# Patient Record
Sex: Male | Born: 1970 | Race: White | Hispanic: No | Marital: Married | Smoking: Never smoker
Health system: Southern US, Community
[De-identification: ages and names within clinical notes are randomized; demographics above are authoritative.]

## PROBLEM LIST (undated history)

## (undated) HISTORY — PX: LASIK: SHX215

---

## 2016-04-23 ENCOUNTER — Emergency Department (HOSPITAL_COMMUNITY): Payer: BLUE CROSS/BLUE SHIELD

## 2016-04-23 ENCOUNTER — Encounter (HOSPITAL_COMMUNITY): Payer: Self-pay | Admitting: Emergency Medicine

## 2016-04-23 ENCOUNTER — Emergency Department (HOSPITAL_COMMUNITY)
Admission: EM | Admit: 2016-04-23 | Discharge: 2016-04-23 | Disposition: A | Discharge status: Home / Self Care | Payer: BLUE CROSS/BLUE SHIELD | Attending: Emergency Medicine | Admitting: Emergency Medicine

## 2016-04-23 DIAGNOSIS — Z87891 Personal history of nicotine dependence: Secondary | ICD-10-CM | POA: Insufficient documentation

## 2016-04-23 DIAGNOSIS — R079 Chest pain, unspecified: Secondary | ICD-10-CM | POA: Diagnosis present

## 2016-04-23 LAB — BASIC METABOLIC PANEL
Anion gap: 8 (ref 5–15)
BUN: 20 mg/dL (ref 6–20)
CHLORIDE: 105 mmol/L (ref 101–111)
CO2: 27 mmol/L (ref 22–32)
Calcium: 9.1 mg/dL (ref 8.9–10.3)
Creatinine, Ser: 1.44 mg/dL — ABNORMAL HIGH (ref 0.61–1.24)
GFR calc non Af Amer: 57 mL/min — ABNORMAL LOW (ref >60)
Glucose, Bld: 156 mg/dL — ABNORMAL HIGH (ref 65–99)
POTASSIUM: 3.8 mmol/L (ref 3.5–5.1)
SODIUM: 140 mmol/L (ref 135–145)

## 2016-04-23 LAB — CBC
HCT: 45.2 % (ref 39.0–52.0)
HEMOGLOBIN: 15.3 g/dL (ref 13.0–17.0)
MCH: 29.6 pg (ref 26.0–34.0)
MCHC: 33.8 g/dL (ref 30.0–36.0)
MCV: 87.4 fL (ref 78.0–100.0)
Platelets: 230 10*3/uL (ref 150–400)
RBC: 5.17 MIL/uL (ref 4.22–5.81)
RDW: 12.6 % (ref 11.5–15.5)
WBC: 6 10*3/uL (ref 4.0–10.5)

## 2016-04-23 LAB — HEPATIC FUNCTION PANEL
ALBUMIN: 4.2 g/dL (ref 3.5–5.0)
ALT: 18 U/L (ref 17–63)
AST: 19 U/L (ref 15–41)
Alkaline Phosphatase: 66 U/L (ref 38–126)
BILIRUBIN INDIRECT: 0.5 mg/dL (ref 0.3–0.9)
Bilirubin, Direct: 0.2 mg/dL (ref 0.1–0.5)
TOTAL PROTEIN: 6.7 g/dL (ref 6.5–8.1)
Total Bilirubin: 0.7 mg/dL (ref 0.3–1.2)

## 2016-04-23 LAB — D-DIMER, QUANTITATIVE (NOT AT ARMC)

## 2016-04-23 LAB — LIPASE, BLOOD: LIPASE: 26 U/L (ref 11–51)

## 2016-04-23 LAB — TROPONIN I

## 2016-04-23 MED ORDER — GI COCKTAIL ~~LOC~~
30.0000 mL | Freq: Once | ORAL | Status: DC
  Filled 2016-04-23: qty 30

## 2016-04-23 NOTE — Discharge Instructions (Signed)
Follow up with dr. Karilyn Cota for a family md and follow up with the cardiologist at [facility].  Take one baby aspirin a day until recheck

## 2016-04-23 NOTE — ED Provider Notes (Signed)
[facility]-EM[facility]Y DEPT Provider Note   CSN: [id number] Arrival date & time: 04/23/16  1331  First Provider Contact:  First MD Initiated Contact with Patient 04/23/16 1342        History   Chief Complaint Chief Complaint  Patient presents with  . Chest Pain    For two weeks    HPI Matthew Charles is a 45 y.o. male.  Patient states that he's been having some left-sided chest pain and pain in his left arm. Patient has no pain now. Patient states is not related to physical activity   The history is provided by the patient. No language interpreter was used.  Chest Pain   This is a new problem. The current episode started more than 1 week ago. The problem occurs daily. The problem has been resolved. Associated with: Nothing. Pain location: Left side chest pain. The pain is at a severity of 3/10. The pain is mild. The quality of the pain is described as brief. Radiates to: Left arm. Exacerbated by: Dilated to anything. Pertinent negatives include no abdominal pain, no back pain, no cough and no headaches. He has tried rest for the symptoms. The treatment provided no relief. There are no known risk factors.  Pertinent negatives for past medical history include no aneurysm and no seizures.    History reviewed. No pertinent past medical history.  There are no active problems to display for this patient.   Past Surgical History:  Procedure Laterality Date  . LASIK         Home Medications    Prior to Admission medications   Not on File    Family History History reviewed. No pertinent family history.  Social History Social History  Substance Use Topics  . Smoking status: Former Games developer  . Smokeless tobacco: Never Used  . Alcohol use Yes     Allergies   Review of patient's allergies indicates no known allergies.   Review of Systems Review of Systems  Constitutional: Negative for appetite change and fatigue.  HENT: Negative for congestion, ear discharge and sinus  pressure.   Eyes: Negative for discharge.  Respiratory: Negative for cough.   Cardiovascular: Positive for chest pain.  Gastrointestinal: Negative for abdominal pain and diarrhea.  Genitourinary: Negative for frequency and hematuria.  Musculoskeletal: Negative for back pain.  Skin: Negative for rash.  Neurological: Negative for seizures and headaches.  Psychiatric/Behavioral: Negative for hallucinations.     Physical Exam Updated Vital Signs BP 129/91   Pulse 61   Temp 97.8 F (36.6 C) (Oral)   Resp 13   Ht 5\' 6"  (1.676 m)   Wt 177 lb (80.3 kg)   SpO2 95%   BMI 28.57 kg/m   Physical Exam  Constitutional: He is oriented to person, place, and time. He appears well-developed.  HENT:  Head: Normocephalic.  Eyes: Conjunctivae and EOM are normal. No scleral icterus.  Neck: Neck supple. No thyromegaly present.  Cardiovascular: Normal rate and regular rhythm.  Exam reveals no gallop and no friction rub.   No murmur heard. Pulmonary/Chest: No stridor. He has no wheezes. He has no rales. He exhibits no tenderness.  Abdominal: He exhibits no distension. There is no tenderness. There is no rebound.  Musculoskeletal: Normal range of motion. He exhibits no edema.  Lymphadenopathy:    He has no cervical adenopathy.  Neurological: He is oriented to person, place, and time. He exhibits normal muscle tone. Coordination normal.  Skin: No rash noted. No erythema.  Psychiatric: He has  a normal mood and affect. His behavior is normal.     ED Treatments / Results  Labs (all labs ordered are listed, but only abnormal results are displayed) Labs Reviewed  BASIC METABOLIC PANEL - Abnormal; Notable for the following:       Result Value   Glucose, Bld 156 (*)    Creatinine, Ser 1.44 (*)    GFR calc non Af Amer 57 (*)    All other components within normal limits  CBC  TROPONIN I  HEPATIC FUNCTION PANEL  LIPASE, BLOOD  D-DIMER, QUANTITATIVE (NOT AT [facility])    EKG  EKG  Interpretation  Date/Time:  Wednesday April 23 2016 13:40:33 EDT Ventricular Rate:  78 PR Interval:    QRS Duration: 94 QT Interval:  382 QTC Calculation: 436 R Axis:   64 Text Interpretation:  Sinus rhythm unifocal pvc Confirmed by Lydiah Pong  MD, Lynsie Mcwatters [phone]) on 04/23/2016 4:31:40 PM       Radiology Dg Chest Portable 1 View  Result Date: 04/23/2016 CLINICAL DATA:  Chest pain for 2 weeks EXAM: PORTABLE CHEST 1 VIEW COMPARISON:  None. FINDINGS: Lungs are clear. Heart size and pulmonary vascularity are normal. No adenopathy. No pneumothorax. No bone lesions. IMPRESSION: No edema or consolidation. Electronically Signed   By: Bretta Bang III M.D.   On: 04/23/2016 14:10   Procedures Procedures (including critical care time)  Medications Ordered in ED Medications  gi cocktail (Maalox,Lidocaine,Donnatal) (30 mLs Oral Refused 04/23/16 1418)     Initial Impression / Assessment and Plan / ED Course  I have reviewed the triage vital signs and the nursing notes.  Pertinent labs & imaging results that were available during my care of the patient were reviewed by me and considered in my medical decision making (see chart for details).  Clinical Course    Patient's labs unremarkable including a troponin. EKG shows unifocal PVCs.  Final Clinical Impressions(s) / ED Diagnoses   Final diagnoses:  Chest pain syndrome   Patient with chest pain doubt cardiac related. Patient referred to a family doctor and cardiologist for recheck New Prescriptions New Prescriptions   No medications on file     Bethann Berkshire, MD 04/23/16 1645

## 2016-04-23 NOTE — ED Triage Notes (Addendum)
Patient sent from [facility] in  [place] for evaluation of intermittent chest pain x 2 weeks radiating into left arm. States he took gas-x with relief yesterday. Denies chest pain at triage. States he was given 324 chewable ASA at [facility].

## 2016-05-07 ENCOUNTER — Encounter: Payer: Self-pay | Admitting: Cardiovascular Disease

## 2016-05-07 ENCOUNTER — Ambulatory Visit (INDEPENDENT_AMBULATORY_CARE_PROVIDER_SITE_OTHER): Payer: BLUE CROSS/BLUE SHIELD | Admitting: Cardiovascular Disease

## 2016-05-07 VITALS — BP 122/98 | HR 98 | Ht 72.0 in | Wt 182.0 lb

## 2016-05-07 DIAGNOSIS — R0789 Other chest pain: Secondary | ICD-10-CM

## 2016-05-07 DIAGNOSIS — N189 Chronic kidney disease, unspecified: Secondary | ICD-10-CM

## 2016-05-07 DIAGNOSIS — IMO0001 Reserved for inherently not codable concepts without codable children: Secondary | ICD-10-CM

## 2016-05-07 DIAGNOSIS — R030 Elevated blood-pressure reading, without diagnosis of hypertension: Secondary | ICD-10-CM

## 2016-05-07 NOTE — Progress Notes (Signed)
CARDIOLOGY CONSULT NOTE  Patient ID: Neri Samek MRN: [id number] DOB/AGE: 1971/06/08 45 y.o.  Admit date: (Not on file) Primary Physician: No PCP Per Patient Referring Physician: Estell Harpin MD  Reason for Consultation: chest pain  HPI: 45 year old male referred for evaluation of chest pain. Evaluated in the ED 7/26. I personally reviewed all labs and studies. D-dimer, lipase, hepatic function panel, CBC, and basic metabolic panel were all unremarkable other than demonstrating CKD.  Chest x-ray was normal.  ECG showed sinus rhythm with PVCs.  He denies exertional symptoms altogether. For 2 weeks prior to this presentation he experienced a dull ache in the left side of his chest, left upper quadrant of his abdomen, and left axilla. He can walk indefinitely and climb several flights of stairs without chest pain or shortness of breath. He denies leg swelling, palpitations, and syncope.  He has yet to establish with a PCP. He is unaware of cardiovascular risk factors. He does not smoke. He has no family history of premature coronary artery disease.     No Known Allergies  Current Outpatient Prescriptions  Medication Sig Dispense Refill  . aspirin 81 MG tablet Take 81 mg by mouth daily.     No current facility-administered medications for this visit.     No past medical history on file.  Past Surgical History:  Procedure Laterality Date  . LASIK      Social History   Social History  . Marital status: Married    Spouse name: N/A  . Number of children: N/A  . Years of education: N/A   Occupational History  . Not on file.   Social History Main Topics  . Smoking status: Never Smoker  . Smokeless tobacco: Never Used  . Alcohol use Yes  . Drug use: No  . Sexual activity: Not on file   Other Topics Concern  . Not on file   Social History Narrative  . No narrative on file     No family history of premature CAD in 1st degree relatives.  Prior to Admission  medications   Medication Sig Start Date End Date Taking? Authorizing Provider  aspirin 81 MG tablet Take 81 mg by mouth daily.   Yes Historical Provider, MD     Review of systems complete and found to be negative unless listed above in HPI     Physical exam Blood pressure (!) 122/98, pulse 98, height 6' (1.829 m), weight 182 lb (82.6 kg), SpO2 98 %. General: NAD Neck: No JVD, no thyromegaly or thyroid nodule.  Lungs: Clear to auscultation bilaterally with normal respiratory effort. CV: Nondisplaced PMI. Regular rate and rhythm, normal S1/S2, no S3/S4, no murmur.  No peripheral edema.  No carotid bruit.    Abdomen: Soft, nontender, no hepatosplenomegaly, no distention.  Skin: Intact without lesions or rashes.  Neurologic: Alert and oriented x 3.  Psych: Normal affect. Extremities: No clubbing or cyanosis.  HEENT: Normal.   ECG: Most recent ECG reviewed.  Labs:   Lab Results  Component Value Date   WBC 6.0 04/23/2016   HGB 15.3 04/23/2016   HCT 45.2 04/23/2016   MCV 87.4 04/23/2016   PLT 230 04/23/2016   No results for input(s): NA, K, CL, CO2, BUN, CREATININE, CALCIUM, PROT, BILITOT, ALKPHOS, ALT, AST, GLUCOSE in the last 168 hours.  Invalid input(s): LABALBU Lab Results  Component Value Date   TROPONINI <0.03 04/23/2016   No results found for: CHOL No results found for: HDL No  results found for: LDLCALC No results found for: TRIG No results found for: CHOLHDL No results found for: LDLDIRECT       Studies: No results found.  ASSESSMENT AND PLAN:  1. Chest pain: Likely non cardiac. If he were to develop exertional symptoms I would then consider exercise treadmill stress testing if clear-cut risk factors are demonstrated.  2. Elevated DBP: Needs monitoring.  3. CKD: Needs further investigation by PCP.  Dispo: fu prn.   Signed: Prentice DockerSuresh Ceriah Kohler, M.D., F.A.C.C.  05/07/2016, 8:40 AM

## 2016-05-07 NOTE — Patient Instructions (Signed)
Medication Instructions:  Continue all current medications.  Labwork: None.   Testing/Procedures: None.  Follow-Up: As needed.   Any Other Special Instructions Will Be Listed Below (If Applicable).  If you need a refill on your cardiac medications before your next appointment, please call your pharmacy.  

## 2018-04-02 IMAGING — CR DG CHEST 1V PORT
1 series · 1 of 1 positions shown · non-contrast
Comparison: None.

CLINICAL DATA: Chest pain for 2 weeks

EXAM:
PORTABLE CHEST 1 VIEW

[ap portable]
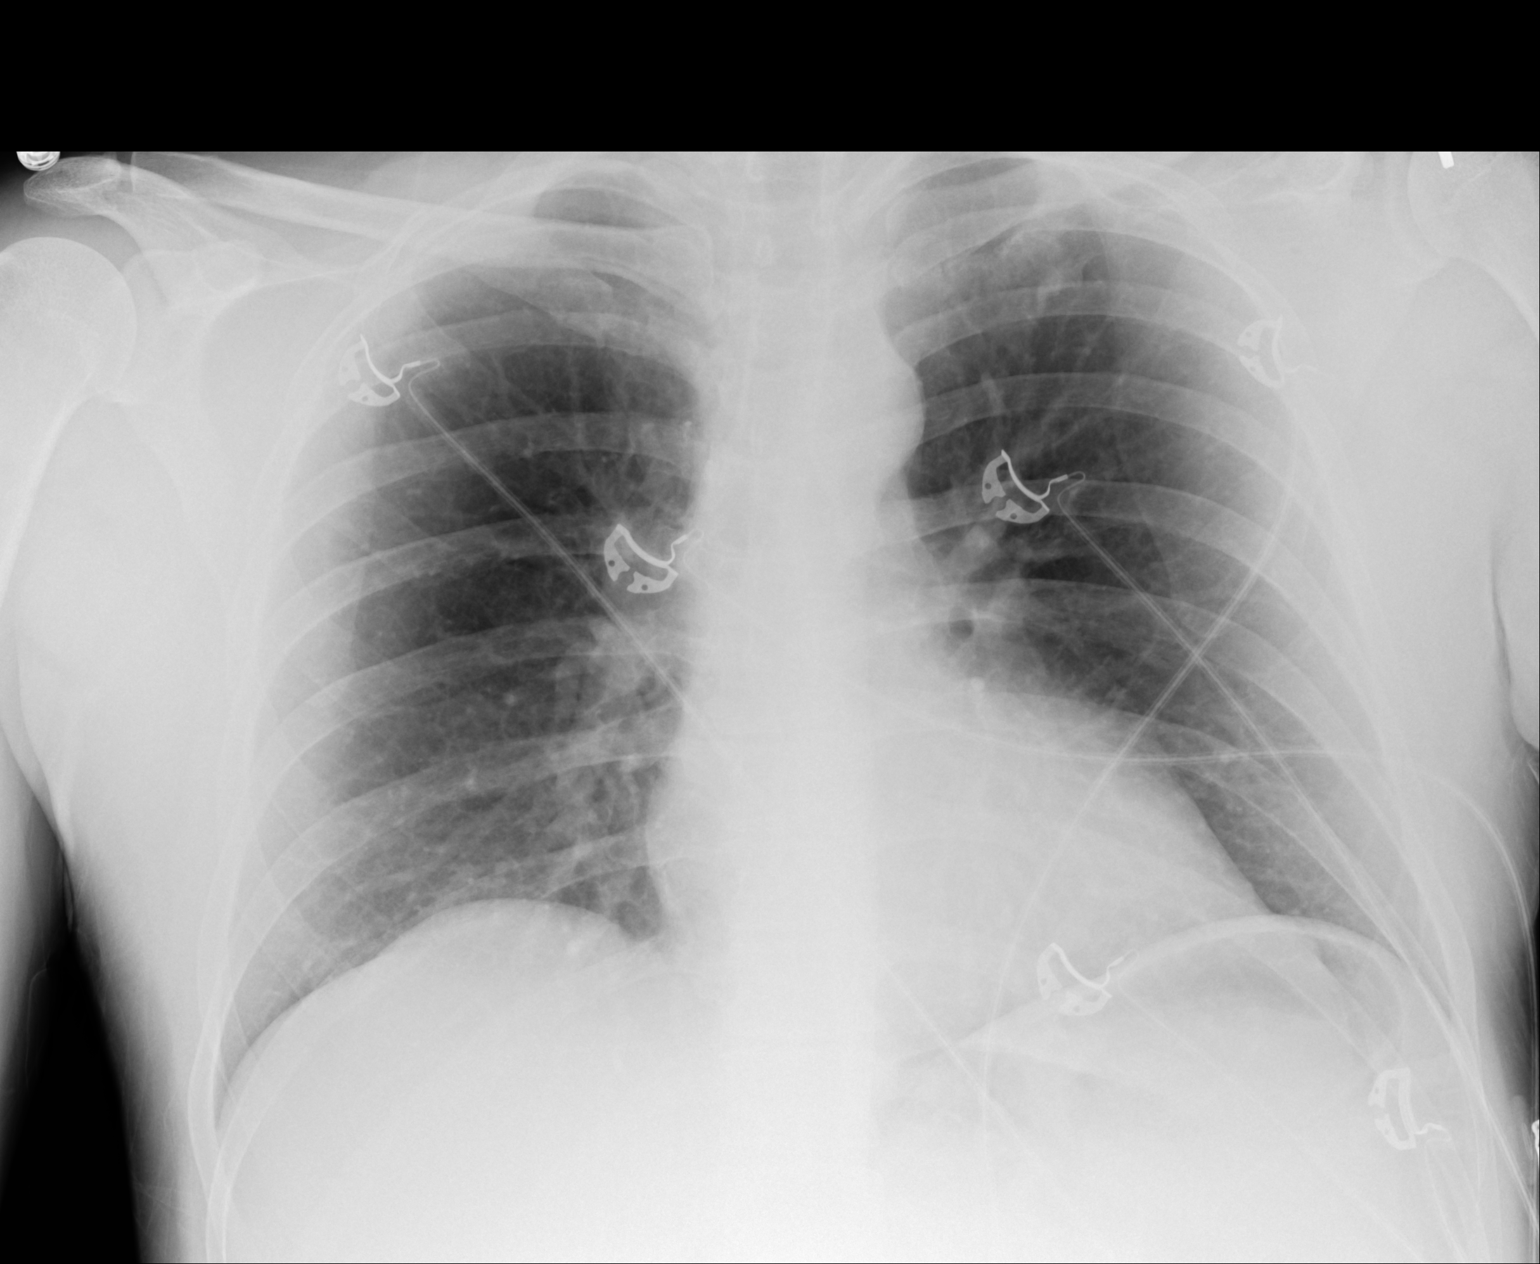

[1 of 1 positions shown; findings below may reference images not displayed]

FINDINGS: Lungs are clear. Heart size and pulmonary vascularity are normal. No
adenopathy. No pneumothorax. No bone lesions.
IMPRESSION: No edema or consolidation.

## 2021-02-06 ENCOUNTER — Ambulatory Visit: Payer: BC Managed Care – PPO | Admitting: Diagnostic Neuroimaging
